# Patient Record
Sex: Male | Born: 1970 | Race: Black or African American | Hispanic: No | Marital: Married | Smoking: Current some day smoker
Health system: Southern US, Community
[De-identification: ages and names within clinical notes are randomized; demographics above are authoritative.]

## PROBLEM LIST (undated history)

## (undated) DIAGNOSIS — K37 Unspecified appendicitis: Secondary | ICD-10-CM

## (undated) DIAGNOSIS — I10 Essential (primary) hypertension: Secondary | ICD-10-CM

## (undated) DIAGNOSIS — J45909 Unspecified asthma, uncomplicated: Secondary | ICD-10-CM

## (undated) HISTORY — DX: Unspecified appendicitis: K37

## (undated) HISTORY — DX: Unspecified asthma, uncomplicated: J45.909

---

## 2005-03-20 ENCOUNTER — Emergency Department: Payer: Self-pay | Admitting: Emergency Medicine

## 2009-05-11 ENCOUNTER — Emergency Department: Payer: Self-pay | Admitting: Emergency Medicine

## 2015-02-24 ENCOUNTER — Emergency Department: Payer: BLUE CROSS/BLUE SHIELD | Admitting: Anesthesiology

## 2015-02-24 ENCOUNTER — Emergency Department: Payer: BLUE CROSS/BLUE SHIELD

## 2015-02-24 ENCOUNTER — Observation Stay
Admission: EM | Admit: 2015-02-24 | Discharge: 2015-02-25 | Disposition: A | Discharge status: Home / Self Care | Payer: BLUE CROSS/BLUE SHIELD | Attending: Surgery | Admitting: Surgery

## 2015-02-24 ENCOUNTER — Encounter: Payer: Self-pay | Admitting: Emergency Medicine

## 2015-02-24 ENCOUNTER — Encounter
Admission: EM | Disposition: A | Discharge status: Home / Self Care | Payer: Self-pay | Source: Home / Self Care | Attending: Emergency Medicine

## 2015-02-24 DIAGNOSIS — K353 Acute appendicitis with localized peritonitis, without perforation or gangrene: Secondary | ICD-10-CM | POA: Insufficient documentation

## 2015-02-24 DIAGNOSIS — D72829 Elevated white blood cell count, unspecified: Secondary | ICD-10-CM | POA: Diagnosis not present

## 2015-02-24 DIAGNOSIS — Z888 Allergy status to other drugs, medicaments and biological substances status: Secondary | ICD-10-CM | POA: Insufficient documentation

## 2015-02-24 DIAGNOSIS — F17200 Nicotine dependence, unspecified, uncomplicated: Secondary | ICD-10-CM | POA: Diagnosis not present

## 2015-02-24 DIAGNOSIS — Z7982 Long term (current) use of aspirin: Secondary | ICD-10-CM | POA: Diagnosis not present

## 2015-02-24 DIAGNOSIS — K37 Unspecified appendicitis: Secondary | ICD-10-CM | POA: Diagnosis present

## 2015-02-24 HISTORY — PX: LAPAROSCOPIC APPENDECTOMY: SHX408

## 2015-02-24 LAB — URINALYSIS COMPLETE WITH MICROSCOPIC (ARMC ONLY)
BACTERIA UA: NONE SEEN
BILIRUBIN URINE: NEGATIVE
GLUCOSE, UA: NEGATIVE mg/dL
Hgb urine dipstick: NEGATIVE
Ketones, ur: NEGATIVE mg/dL
Leukocytes, UA: NEGATIVE
Nitrite: NEGATIVE
Protein, ur: NEGATIVE mg/dL
SQUAMOUS EPITHELIAL / LPF: NONE SEEN
Specific Gravity, Urine: 1.019 (ref 1.005–1.030)
pH: 7 (ref 5.0–8.0)

## 2015-02-24 LAB — COMPREHENSIVE METABOLIC PANEL
ALBUMIN: 4 g/dL (ref 3.5–5.0)
ALT: 45 U/L (ref 17–63)
AST: 22 U/L (ref 15–41)
Alkaline Phosphatase: 53 U/L (ref 38–126)
Anion gap: 8 (ref 5–15)
BILIRUBIN TOTAL: 0.9 mg/dL (ref 0.3–1.2)
BUN: 8 mg/dL (ref 6–20)
CHLORIDE: 103 mmol/L (ref 101–111)
CO2: 25 mmol/L (ref 22–32)
Calcium: 9.1 mg/dL (ref 8.9–10.3)
Creatinine, Ser: 1.11 mg/dL (ref 0.61–1.24)
GFR calc Af Amer: >60 mL/min (ref >60)
GFR calc non Af Amer: >60 mL/min (ref >60)
GLUCOSE: 112 mg/dL — AB (ref 65–99)
POTASSIUM: 3.9 mmol/L (ref 3.5–5.1)
Sodium: 136 mmol/L (ref 135–145)
TOTAL PROTEIN: 8.1 g/dL (ref 6.5–8.1)

## 2015-02-24 LAB — CBC
HEMATOCRIT: 45 % (ref 40.0–52.0)
Hemoglobin: 15.2 g/dL (ref 13.0–18.0)
MCH: 30.9 pg (ref 26.0–34.0)
MCHC: 33.8 g/dL (ref 32.0–36.0)
MCV: 91.4 fL (ref 80.0–100.0)
Platelets: 317 10*3/uL (ref 150–440)
RBC: 4.92 MIL/uL (ref 4.40–5.90)
RDW: 14.5 % (ref 11.5–14.5)
WBC: 22.2 10*3/uL — ABNORMAL HIGH (ref 3.8–10.6)

## 2015-02-24 LAB — LIPASE, BLOOD: Lipase: 26 U/L (ref 11–51)

## 2015-02-24 SURGERY — APPENDECTOMY, LAPAROSCOPIC
Anesthesia: General | Site: Abdomen | Wound class: Clean Contaminated

## 2015-02-24 MED ORDER — SUCCINYLCHOLINE CHLORIDE 20 MG/ML IJ SOLN
INTRAMUSCULAR | Status: DC | PRN
  Administered 2015-02-24: 120 mg via INTRAVENOUS

## 2015-02-24 MED ORDER — FENTANYL CITRATE (PF) 100 MCG/2ML IJ SOLN
INTRAMUSCULAR | Status: DC | PRN
  Administered 2015-02-24 (×2): 50 ug via INTRAVENOUS
  Administered 2015-02-24: 100 ug via INTRAVENOUS

## 2015-02-24 MED ORDER — LIDOCAINE HCL (CARDIAC) 20 MG/ML IV SOLN
INTRAVENOUS | Status: DC | PRN
  Administered 2015-02-24: 50 mg via INTRAVENOUS

## 2015-02-24 MED ORDER — MORPHINE SULFATE (PF) 4 MG/ML IV SOLN
4.0000 mg | Freq: Once | INTRAVENOUS | Status: AC
  Administered 2015-02-24: 4 mg via INTRAVENOUS
  Filled 2015-02-24: qty 1

## 2015-02-24 MED ORDER — SODIUM CHLORIDE 0.9 % IV SOLN
1.0000 g | INTRAVENOUS | Status: DC
  Administered 2015-02-24: 1 g via INTRAVENOUS
  Filled 2015-02-24 (×2): qty 1

## 2015-02-24 MED ORDER — IOHEXOL 240 MG/ML SOLN
25.0000 mL | Freq: Once | INTRAMUSCULAR | Status: AC | PRN
  Administered 2015-02-24: 25 mL via ORAL

## 2015-02-24 MED ORDER — ONDANSETRON HCL 4 MG/2ML IJ SOLN
4.0000 mg | Freq: Once | INTRAMUSCULAR | Status: AC
  Administered 2015-02-24: 4 mg via INTRAVENOUS
  Filled 2015-02-24: qty 2

## 2015-02-24 MED ORDER — PROPOFOL 10 MG/ML IV BOLUS
INTRAVENOUS | Status: DC | PRN
  Administered 2015-02-24: 200 mg via INTRAVENOUS

## 2015-02-24 MED ORDER — MIDAZOLAM HCL 2 MG/2ML IJ SOLN
INTRAMUSCULAR | Status: DC | PRN
  Administered 2015-02-24: 2 mg via INTRAVENOUS

## 2015-02-24 MED ORDER — IOHEXOL 350 MG/ML SOLN
100.0000 mL | Freq: Once | INTRAVENOUS | Status: AC | PRN
  Administered 2015-02-24: 100 mL via INTRAVENOUS

## 2015-02-24 SURGICAL SUPPLY — 43 items
APPLIER CLIP ROT 10 11.4 M/L (STAPLE) ×3
BAG COUNTER SPONGE EZ (MISCELLANEOUS) IMPLANT
BLADE SURG SZ11 CARB STEEL (BLADE) ×3 IMPLANT
CANISTER SUCT 1200ML W/VALVE (MISCELLANEOUS) ×3 IMPLANT
CATH TRAY 16F METER LATEX (MISCELLANEOUS) ×3 IMPLANT
CHLORAPREP W/TINT 26ML (MISCELLANEOUS) ×3 IMPLANT
CLIP APPLIE ROT 10 11.4 M/L (STAPLE) ×1 IMPLANT
CLOSURE WOUND 1/2 X4 (GAUZE/BANDAGES/DRESSINGS) ×1
COUNTER SPONGE BAG EZ (MISCELLANEOUS)
CUTTER LINEAR ENDO 35 ART THIN (STAPLE) ×3 IMPLANT
DRSG TEGADERM 2-3/8X2-3/4 SM (GAUZE/BANDAGES/DRESSINGS) ×9 IMPLANT
DRSG TELFA 3X8 NADH (GAUZE/BANDAGES/DRESSINGS) ×3 IMPLANT
ENDOLOOP SUT PDS II  0 18 (SUTURE) ×2
ENDOLOOP SUT PDS II 0 18 (SUTURE) ×1 IMPLANT
ENDOPOUCH RETRIEVER 10 (MISCELLANEOUS) ×3 IMPLANT
GLOVE BIO SURGEON STRL SZ7.5 (GLOVE) ×21 IMPLANT
GOWN STRL REUS W/ TWL LRG LVL3 (GOWN DISPOSABLE) ×3 IMPLANT
GOWN STRL REUS W/TWL LRG LVL3 (GOWN DISPOSABLE) ×6
IRRIGATION STRYKERFLOW (MISCELLANEOUS) ×1 IMPLANT
IRRIGATOR STRYKERFLOW (MISCELLANEOUS) ×3
IV NS 1000ML (IV SOLUTION) ×2
IV NS 1000ML BAXH (IV SOLUTION) ×1 IMPLANT
KIT RM TURNOVER STRD PROC AR (KITS) ×3 IMPLANT
LABEL OR SOLS (LABEL) ×3 IMPLANT
NEEDLE HYPO 25X1 1.5 SAFETY (NEEDLE) ×3 IMPLANT
NS IRRIG 500ML POUR BTL (IV SOLUTION) ×3 IMPLANT
PACK LAP CHOLECYSTECTOMY (MISCELLANEOUS) ×3 IMPLANT
PAD GROUND ADULT SPLIT (MISCELLANEOUS) ×3 IMPLANT
RELOAD CUTTER ETS 35MM STAND (ENDOMECHANICALS) ×6 IMPLANT
SCISSORS METZENBAUM CVD 33 (INSTRUMENTS) ×3 IMPLANT
SEAL FOR SCOPE WARMER C3101 (MISCELLANEOUS) ×3 IMPLANT
SLEEVE ENDOPATH XCEL 5M (ENDOMECHANICALS) ×3 IMPLANT
SLEEVE SCD COMPRESS THIGH MED (MISCELLANEOUS) ×3 IMPLANT
STRIP CLOSURE SKIN 1/2X4 (GAUZE/BANDAGES/DRESSINGS) ×2 IMPLANT
SUT MNCRL 4-0 (SUTURE) ×2
SUT MNCRL 4-0 27XMFL (SUTURE) ×1
SUT VICRYL 0 AB UR-6 (SUTURE) ×6 IMPLANT
SUTURE MNCRL 4-0 27XMF (SUTURE) ×1 IMPLANT
SWABSTK COMLB BENZOIN TINCTURE (MISCELLANEOUS) ×3 IMPLANT
TROCAR XCEL BLUNT TIP 100MML (ENDOMECHANICALS) ×3 IMPLANT
TROCAR XCEL NON-BLD 5MMX100MML (ENDOMECHANICALS) ×3 IMPLANT
TUBING INSUFFLATOR HI FLOW (MISCELLANEOUS) ×3 IMPLANT
WATER STERILE IRR 1000ML POUR (IV SOLUTION) ×3 IMPLANT

## 2015-02-24 NOTE — ED Provider Notes (Signed)
[facility] Emergency Department Provider Note  ____________________________________________  Time seen: 1:40 PM  I have reviewed the triage vital signs and the nursing notes.   HISTORY  Chief Complaint Abdominal Pain    HPI Ferdie L Davitt is a 44 y.o. male who presents with severe sharp right lower quadrant abdominal pain which started yesterday. He reports the pain steadily worsened over the last 24 hours. Denies fevers but has had chills. Decreased appetite. Mild nausea but no vomiting. No history of similar pain. No dysuria or hematuria. No sick contacts. No diarrhea.     History reviewed. No pertinent past medical history.  There are no active problems to display for this patient.   History reviewed. No pertinent past surgical history.  No current outpatient prescriptions on file.  Allergies Reglan  No family history on file.  Social History Social History  Substance Use Topics  . Smoking status: Current Some Day Smoker  . Smokeless tobacco: None  . Alcohol Use: Yes    Review of Systems  Constitutional: Negative for fever. Positive for chills Eyes: Negative for visual changes. ENT: Negative for sore throat Cardiovascular: Negative for chest pain. Respiratory: Negative for shortness of breath. Gastrointestinal: Positive for abdominal pain, decreased by mouth intake Genitourinary: Negative for dysuria. Musculoskeletal: Negative for back pain. Skin: Negative for rash. Neurological: Negative for headaches or focal weakness Psychiatric: No anxiety    ____________________________________________   PHYSICAL EXAM:  VITAL SIGNS: ED Triage Vitals  Enc Vitals Group     BP 02/24/15 1226 135/86 mmHg     Pulse Rate 02/24/15 1226 84     Resp 02/24/15 1226 18     Temp 02/24/15 1226 98.5 F (36.9 C)     Temp Source 02/24/15 1226 Oral     SpO2 02/24/15 1226 96 %     Weight 02/24/15 1226 200 lb (90.719 kg)     Height 02/24/15 1226 5'  9" (1.753 m)     Head Cir --      Peak Flow --      Pain Score 02/24/15 1224 8     Pain Loc --      Pain Edu? --      Excl. in GC? --      Constitutional: Alert and oriented. Well appearing and in no distress. Eyes: Conjunctivae are normal.  ENT   Head: Normocephalic and atraumatic.   Mouth/Throat: Mucous membranes are moist. Cardiovascular: Normal rate, regular rhythm. Normal and symmetric distal pulses are present in all extremities. No murmurs, rubs, or gallops. Respiratory: Normal respiratory effort without tachypnea nor retractions. Breath sounds are clear and equal bilaterally.  Gastrointestinal: Significant tenderness to palpation at McBurney's point. No distention. There is no CVA tenderness. Genitourinary: deferred Musculoskeletal: Nontender with normal range of motion in all extremities. No lower extremity tenderness nor edema. Neurologic:  Normal speech and language. No gross focal neurologic deficits are appreciated. Skin:  Skin is warm, dry and intact. No rash noted. Psychiatric: Mood and affect are normal. Patient exhibits appropriate insight and judgment.  ____________________________________________    LABS (pertinent positives/negatives)  Labs Reviewed  COMPREHENSIVE METABOLIC PANEL - Abnormal; Notable for the following:    Glucose, Bld 112 (*)    All other components within normal limits  CBC - Abnormal; Notable for the following:    WBC 22.2 (*)    All other components within normal limits  LIPASE, BLOOD  URINALYSIS COMPLETEWITH MICROSCOPIC ([facility] ONLY)    ____________________________________________   EKG  None  ____________________________________________    RADIOLOGY I have personally reviewed any xrays that were ordered on this patient: CT consistent with acute appendicitis  ____________________________________________   PROCEDURES  Procedure(s) performed: none  Critical Care performed:  none  ____________________________________________   INITIAL IMPRESSION / ASSESSMENT AND PLAN / ED COURSE  Pertinent labs & imaging results that were available during my care of the patient were reviewed by me and considered in my medical decision making (see chart for details).  Patient's presentation is highly suspicious for appendicitis. We will obtain CT of the pelvis, give morphine 4 mg and Zofran 4 mg IV and reevaluate. Patient has been made nothing by mouth, he has not had anything since this morning when he had a small amount of soup   ----------------------------------------- 3:00 PM on 02/24/2015 -----------------------------------------  Called by radiology and notified of acute appendicitis on CT abdomen pelvis. Consulted surgery for evaluation and admission ____________________________________________   FINAL CLINICAL IMPRESSION(S) / ED DIAGNOSES  Final diagnoses:  Acute appendicitis with localized peritonitis     Jene Everyobert Stanislawa Gaffin, MD 02/24/15 1500

## 2015-02-24 NOTE — Anesthesia Preprocedure Evaluation (Addendum)
Anesthesia Evaluation  Patient identified by MRN, date of birth, ID band Patient awake    Reviewed: Allergy & Precautions, NPO status , Patient's Chart, lab work & pertinent test results  Airway Mallampati: II  TM Distance: >3 FB Neck ROM: Full    Dental  (+) Chipped   Pulmonary Current Smoker,    Pulmonary exam normal breath sounds clear to auscultation       Cardiovascular negative cardio ROS Normal cardiovascular exam     Neuro/Psych negative neurological ROS  negative psych ROS   GI/Hepatic negative GI ROS, Neg liver ROS,   Endo/Other  negative endocrine ROS  Renal/GU negative Renal ROS  negative genitourinary   Musculoskeletal negative musculoskeletal ROS (+)   Abdominal (+)  Abdomen: tender.    Peds negative pediatric ROS (+)  Hematology negative hematology ROS (+)   Anesthesia Other Findings   Reproductive/Obstetrics                            Anesthesia Physical Anesthesia Plan  ASA: II and emergent  Anesthesia Plan: General   Post-op Pain Management:    Induction: Intravenous, Rapid sequence and Cricoid pressure planned  Airway Management Planned: Oral ETT  Additional Equipment:   Intra-op Plan:   Post-operative Plan: Extubation in OR  Informed Consent: I have reviewed the patients History and Physical, chart, labs and discussed the procedure including the risks, benefits and alternatives for the proposed anesthesia with the patient or authorized representative who has indicated his/her understanding and acceptance.   Dental advisory given  Plan Discussed with: CRNA and Surgeon  Anesthesia Plan Comments:         Anesthesia Quick Evaluation

## 2015-02-24 NOTE — ED Notes (Signed)
MD at bedside.  Dr. Cyril LoosenKinner states its probable that patient has appendicitis.  Will scan patient quickly.

## 2015-02-24 NOTE — ED Notes (Signed)
Pt presents with abd pain since yesterday, having trouble with bowels.

## 2015-02-24 NOTE — ED Notes (Signed)
Back from CT

## 2015-02-24 NOTE — ED Notes (Signed)
Pt states unable to give urine specimen at this time, pt given sterile cup with instructions.

## 2015-02-24 NOTE — ED Notes (Signed)
Patient transported to CT 

## 2015-02-24 NOTE — H&P (Signed)
CC: RLQ pain x 1 day  HPI: [name] is a pleasant healthy 44 yo M who presents with 1 day of RLQ pain.  Began acutely.  Has had similar pain in the past, but only lasts a short time.  + chills, no nausea/vomiting.  Small BM yesterday.  No fevers, chest pain, shortness of breath, cough, chest pain, dysuria/hematuria.  Active Ambulatory Problems    Diagnosis Date Noted  . No Active Ambulatory Problems   Resolved Ambulatory Problems    Diagnosis Date Noted  . No Resolved Ambulatory Problems   No Additional Past Medical History   History reviewed. No pertinent past surgical history.  Hand Surgery - right    Medication List    ASK your doctor about these medications        aspirin EC 81 MG tablet  Take 162 mg by mouth every 4 (four) hours as needed for mild pain.       Allergies  Allergen Reactions  . Reglan [Metoclopramide] Other (See Comments)    Reaction:  Unknown    ROS: Full ROS obtained, pertinent positives and negatives as above  Blood pressure 129/86, pulse 90, temperature 98.5 F (36.9 C), temperature source Oral, resp. rate 19, height 5\' 9"  (1.753 m), weight 200 lb (90.719 kg), SpO2 95 %. GEN: NAD/A&Ox3 FACE: no obvious facial trauma, normal external nose, normal external ears EYES: no scleral icterus, no conjunctivitis HEAD: normocephalic atraumatic CV: RRR, no MRG RESP: moving air well, lungs clear ABD: soft, tender to RLQ, + rebound, nondistended EXT: moving all ext well, strength 5/5 NEURO: cnII-XII grossly intact, sensation intact all 4 ext  Labs: Personally reviewed, significant for  WBC 22.2  CT:  Personally reviewed, significant for edematous, thickened appendix with periappendiceal stranding  A/P 44 yo with RLQ pain, leukocytosis, CT scan concerning for acute appendicitis.  I have recommended laparoscopic appendectomy.  I have explained the procedure and its risks and benefits including bleeding, pain, scarring, possible conversion to open and  possible injury to other structures.  He expresses understanding and would like to proceed.

## 2015-02-25 ENCOUNTER — Encounter: Payer: Self-pay | Admitting: Surgery

## 2015-02-25 DIAGNOSIS — K353 Acute appendicitis with localized peritonitis, without perforation or gangrene: Secondary | ICD-10-CM | POA: Insufficient documentation

## 2015-02-25 DIAGNOSIS — K37 Unspecified appendicitis: Secondary | ICD-10-CM | POA: Diagnosis present

## 2015-02-25 MED ORDER — ACETAMINOPHEN 325 MG PO TABS
650.0000 mg | ORAL_TABLET | Freq: Four times a day (QID) | ORAL | Status: DC | PRN
Start: 2015-02-25 — End: 2015-02-25

## 2015-02-25 MED ORDER — ROCURONIUM BROMIDE 100 MG/10ML IV SOLN
INTRAVENOUS | Status: DC | PRN
  Administered 2015-02-24: 30 mg via INTRAVENOUS

## 2015-02-25 MED ORDER — DEXAMETHASONE SODIUM PHOSPHATE 4 MG/ML IJ SOLN
INTRAMUSCULAR | Status: DC | PRN
  Administered 2015-02-24: 4 mg via INTRAVENOUS

## 2015-02-25 MED ORDER — OXYCODONE-ACETAMINOPHEN 5-325 MG PO TABS
1.0000 | ORAL_TABLET | ORAL | Status: DC | PRN
  Administered 2015-02-25: 1 via ORAL
  Filled 2015-02-25: qty 1

## 2015-02-25 MED ORDER — OXYCODONE-ACETAMINOPHEN 5-325 MG PO TABS: 1.0000 | ORAL_TABLET | ORAL | Status: AC | PRN

## 2015-02-25 MED ORDER — ONDANSETRON HCL 4 MG/2ML IJ SOLN
INTRAMUSCULAR | Status: DC | PRN
  Administered 2015-02-25: 4 mg via INTRAVENOUS

## 2015-02-25 MED ORDER — ONDANSETRON HCL 4 MG/2ML IJ SOLN: 4.0000 mg | Freq: Once | INTRAMUSCULAR | Status: DC | PRN

## 2015-02-25 MED ORDER — PANTOPRAZOLE SODIUM 40 MG IV SOLR
40.0000 mg | Freq: Every day | INTRAVENOUS | Status: DC
  Administered 2015-02-25: 40 mg via INTRAVENOUS
  Filled 2015-02-25 (×5): qty 40

## 2015-02-25 MED ORDER — EPHEDRINE SULFATE 50 MG/ML IJ SOLN
INTRAMUSCULAR | Status: DC | PRN
  Administered 2015-02-24: 10 mg via INTRAVENOUS

## 2015-02-25 MED ORDER — LACTATED RINGERS IV SOLN
INTRAVENOUS | Status: DC | PRN
  Administered 2015-02-24: 23:00:00 via INTRAVENOUS

## 2015-02-25 MED ORDER — BUPIVACAINE-EPINEPHRINE (PF) 0.25% -1:200000 IJ SOLN
INTRAMUSCULAR | Status: DC | PRN
  Administered 2015-02-25: 7.5 mL via PERINEURAL

## 2015-02-25 MED ORDER — ONDANSETRON 4 MG PO TBDP: 4.0000 mg | ORAL_TABLET | Freq: Four times a day (QID) | ORAL | Status: DC | PRN

## 2015-02-25 MED ORDER — NEOSTIGMINE METHYLSULFATE 10 MG/10ML IV SOLN
INTRAVENOUS | Status: DC | PRN
  Administered 2015-02-25: 3 mg via INTRAVENOUS

## 2015-02-25 MED ORDER — PHENYLEPHRINE HCL 10 MG/ML IJ SOLN
INTRAMUSCULAR | Status: DC | PRN
  Administered 2015-02-24 (×3): 100 ug via INTRAVENOUS

## 2015-02-25 MED ORDER — ONDANSETRON HCL 4 MG/2ML IJ SOLN: 4.0000 mg | Freq: Four times a day (QID) | INTRAMUSCULAR | Status: DC | PRN

## 2015-02-25 MED ORDER — FENTANYL CITRATE (PF) 100 MCG/2ML IJ SOLN: 25.0000 ug | INTRAMUSCULAR | Status: DC | PRN

## 2015-02-25 MED ORDER — ACETAMINOPHEN 650 MG RE SUPP
650.0000 mg | Freq: Four times a day (QID) | RECTAL | Status: DC | PRN
  Filled 2015-02-25: qty 1

## 2015-02-25 MED ORDER — SODIUM CHLORIDE 0.9 % IV SOLN
INTRAVENOUS | Status: DC
  Administered 2015-02-25 (×2): via INTRAVENOUS

## 2015-02-25 MED ORDER — GLYCOPYRROLATE 0.2 MG/ML IJ SOLN
INTRAMUSCULAR | Status: DC | PRN
  Administered 2015-02-24: 0.2 mg via INTRAVENOUS
  Administered 2015-02-25: 0.6 mg via INTRAVENOUS

## 2015-02-25 MED ORDER — OXYCODONE-ACETAMINOPHEN 5-325 MG PO TABS: 1.0000 | ORAL_TABLET | ORAL | Status: DC | PRN

## 2015-02-25 MED ORDER — HYDROMORPHONE HCL 1 MG/ML IJ SOLN
0.5000 mg | INTRAMUSCULAR | Status: DC | PRN
  Administered 2015-02-25 (×2): 1 mg via INTRAVENOUS
  Administered 2015-02-25: 0.5 mg via INTRAVENOUS
  Filled 2015-02-25 (×3): qty 1

## 2015-02-25 MED ORDER — LIDOCAINE HCL 1 % IJ SOLN
INTRAMUSCULAR | Status: DC | PRN
  Administered 2015-02-25: 7.5 mL via INTRADERMAL

## 2015-02-25 MED ORDER — PNEUMOCOCCAL VAC POLYVALENT 25 MCG/0.5ML IJ INJ: 0.5000 mL | INJECTION | INTRAMUSCULAR | Status: DC

## 2015-02-25 NOTE — Discharge Instructions (Signed)
Do not drive on pain medications Do not lift greater than 15 lbs for a period of 6 weeks Call or return to ER if you develop fever greater than 101.5, nausea/vomiting, increased pain, redness/drainage from incisions Take bandages off in 48 hours.  Okay to shower with bandages on or after they come off, no tub baths  Follow all MD discharge instructions. Take all medications as prescribed. Keep all follow up appointments. If your symptoms return, call your doctor. If you experience any new symptoms that are of concern to you or that are bothersome to you, call your doctor. For all questions and/or concerns, call your doctor.   If you have a medical emergency, call 911  If you experience any bleeding or discharge from your incision sites, redness or swelling at your incision sites, fever, pain uncontrolled by medication, increase in pain, or any nausea, call your doctor.

## 2015-02-25 NOTE — Transfer of Care (Signed)
Immediate Anesthesia Transfer of Care Note  Patient: Daniel Hughes  Procedure(s) Performed: Procedure(s): APPENDECTOMY LAPAROSCOPIC (N/A)  Patient Location: PACU  Anesthesia Type:General  Level of Consciousness: awake, alert , oriented and patient cooperative  Airway & Oxygen Therapy: Patient Spontanous Breathing and Patient connected to face mask oxygen  Post-op Assessment: Report given to RN and Post -op Vital signs reviewed and stable  Post vital signs: Reviewed and stable  Last Vitals:  Filed Vitals:   02/24/15 2100 02/24/15 2130  BP: 123/85 124/88  Pulse: 75 71  Temp:    Resp: 12 12    Complications: No apparent anesthesia complications

## 2015-02-25 NOTE — Op Note (Signed)
Preoperative dx: Appendicitis Postoperative dx: Appendicitis, gangrenous Procedure: Laparoscopic appendectomy  Surgeon: Finas Delone Anesthesia: GETA EBL: 25 ml Specimen: Appendix.  Indication for procedure: Mr. Raul Dellston presented with 1 day of RLQ pain and clinical and radiographic signs of appendicitis.  We brought him to the OR for surgical management of appendicitis.  Details of procedure:  Informed consent was obtained.  Mr. Raul Dellston was brought to the or suite and laid on the OR table.  He was induced, intubated and general anesthesia administered.  His abdomen was prepped and draped.  Time out performed.  Incision was made over umbilicus.  Deepened down to fascia and fascia incised.  Two stay sutures placed in fasciotomy, hassan trocar placed, abdomen was insufflated.  Two 5 mm trocars were placed under direct visualization in RLQ and suprapubic.  The appendix was visualized and remarkably inflammed.  It curved laterally under the cecum and had small areas of patchy necrosis.  A hole was made in mesoappendix and appendix transected flush with cecum using endoGIA stapler.  The body and tip of the appendix was then brought out from the retroperitoneum.  The mesoappendix was then divided with 2 firings of the endoGIA and electrocautery.  Appendix then removed using endocatch bag.  Abdomen irrigated, hemostasis obtained.  All trocars were then removed under direct visualization.  Supraumbilical fascia closed using previously placed stay sutures.  All skin sites closed with 4-0 monocryl interruped deep dermal sutures.  Steristrips, telfa and tegaderm were used to complete dressing.  Patient awoken, extubated and brought to PACU.  No immediate complications, needle, sponge and instrument count correct at end of procedure.

## 2015-02-25 NOTE — Brief Op Note (Signed)
02/24/2015 - 02/25/2015  1:18 AM  PATIENT:  Daniel Hughes  44 y.o. male  PRE-OPERATIVE DIAGNOSIS:  appendicitis  POST-OPERATIVE DIAGNOSIS:  Appendicitis, gangrenous  PROCEDURE:  Procedure(s): APPENDECTOMY LAPAROSCOPIC (N/A)  SURGEON:  Surgeon(s) and Role:    * Ida Roguehristopher Shacara Cozine, MD - Primary  PHYSICIAN ASSISTANT:   ASSISTANTS: none   ANESTHESIA:   general  EBL:  Total I/O In: 800 [I.V.:800] Out: 210 [Urine:200; Blood:10]  BLOOD ADMINISTERED:none  DRAINS: none   LOCAL MEDICATIONS USED:  LIDOCAINE   SPECIMEN:  Excision  DISPOSITION OF SPECIMEN:  PATHOLOGY  COUNTS:  YES  TOURNIQUET:  * No tourniquets in log *  DICTATION: .Note written in EPIC  PLAN OF CARE: Admit for overnight observation  PATIENT DISPOSITION:  PACU - hemodynamically stable.   Delay start of Pharmacological VTE agent (>24hrs) due to surgical blood loss or risk of bleeding: not applicable

## 2015-02-25 NOTE — Progress Notes (Signed)
Surgery Progress Note  S: Doing well.  Some periumbilical pain O:Blood pressure 111/70, pulse 80, temperature 98.7 F (37.1 C), temperature source Oral, resp. rate 16, height 5\' 9"  (1.753 m), weight 200 lb (90.719 kg), SpO2 96 %. GEN: NAD/A&Ox3 ABD: soft, approp tender, dressings with some blood, intact  A/P 44 yo s/p lap appendectomy, doing well - liquids advance - IV pain meds, oral when tolerating PO

## 2015-02-25 NOTE — Discharge Summary (Signed)
Physician Discharge Summary  Patient ID: Daniel Hughes MRN: [id number] DOB/AGE: 44-Oct-1972 44 y.o.  Admit date: 02/24/2015 Discharge date: 02/25/2015  Admission Diagnoses: Acute appendicitis  Discharge Diagnoses:  Active Problems:   Acute appendicitis with localized peritonitis   Appendicitis   Discharged Condition: good  Hospital Course:  Lap appy on 11/22, doing well today.  Tolerating regular diet, up and moving around.  Pain controlled with PO meds.    Consults: None  Significant Diagnostic Studies: CT san  Treatments: IV hydration  Discharge Exam: Blood pressure 109/77, pulse 74, temperature 98.3 F (36.8 C), temperature source Oral, resp. rate 16, height 5\' 9"  (1.753 m), weight 200 lb (90.719 kg), SpO2 96 %. General appearance: alert, cooperative and no distress GI: soft, appropriately tender, incisions c/d/i Extremities: extremities normal, atraumatic, no cyanosis or edema  Disposition: Final discharge disposition not confirmed  Discharge Instructions    Call MD for:  persistant nausea and vomiting    Complete by:  As directed      Call MD for:  redness, tenderness, or signs of infection (pain, swelling, redness, odor or green/yellow discharge around incision site)    Complete by:  As directed      Call MD for:  severe uncontrolled pain    Complete by:  As directed      Call MD for:  temperature >100.4    Complete by:  As directed      Driving Restrictions    Complete by:  As directed   No driving while on prescription meds     Lifting restrictions    Complete by:  As directed   No lifting over 15 lbs     May shower / Bathe    Complete by:  As directed      May walk up steps    Complete by:  As directed      Remove dressing in 48 hours    Complete by:  As directed             Medication List    TAKE these medications        aspirin EC 81 MG tablet  Take 162 mg by mouth every 4 (four) hours as needed for mild pain.     oxyCODONE-acetaminophen  5-325 MG tablet  Commonly known as:  PERCOCET/ROXICET  Take 1-2 tablets by mouth every 4 (four) hours as needed for moderate pain.     oxyCODONE-acetaminophen 5-325 MG tablet  Commonly known as:  ROXICET  Take 1-2 tablets by mouth every 4 (four) hours as needed for severe pain.           Follow-up Information    Follow up with [facility]. Schedule an appointment as soon as possible for a visit in 1 week.   Why:  Please call as soon as possible to schedule a follow up appointment      Signed: Gladis Riffleatherine L Galvin Aversa 02/25/2015, 6:31 PM

## 2015-02-25 NOTE — Progress Notes (Signed)
Ambulating today,mid abdominal incision with minimal oozing,pain management in progress,iv fluids infusing,vital signs within normal limits.

## 2015-02-25 NOTE — Anesthesia Procedure Notes (Signed)
Procedure Name: Intubation Date/Time: 02/24/2015 11:36 PM Performed by: Waldo LaineJUSTIS, Tiare Rohlman Pre-anesthesia Checklist: Patient identified, Emergency Drugs available, Suction available, Patient being monitored and Timeout performed Patient Re-evaluated:Patient Re-evaluated prior to inductionOxygen Delivery Method: Circle system utilized Preoxygenation: Pre-oxygenation with 100% oxygen Intubation Type: IV induction and Cricoid Pressure applied Laryngoscope Size: Miller and 2 Grade View: Grade II Number of attempts: 1 Airway Equipment and Method: Stylet Placement Confirmation: ETT inserted through vocal cords under direct vision,  positive ETCO2 and breath sounds checked- equal and bilateral Secured at: 22 cm Tube secured with: Tape Dental Injury: Teeth and Oropharynx as per pre-operative assessment

## 2015-02-25 NOTE — Progress Notes (Signed)
Pt d/c home; d/c instructions reviewed w/ pt; pt understanding was verbalized; IV removed catheter in tact, gauze dressing applied; all pt questions answered; pt awaiting ride to arrive; pt instructed to notify RN or nurse's station when ride arrives, and that he would be taken down by wheelchair; oncoming charge nurse Leavy CellaJasmine notified that pt awaiting ride home to arrive

## 2015-02-27 LAB — SURGICAL PATHOLOGY

## 2015-02-27 NOTE — Addendum Note (Signed)
Addendum  created 02/27/15 0906 by Yves DillPaul Theo Reither, MD   Modules edited: Clinical Notes   Clinical Notes:  File: [id number]

## 2015-02-27 NOTE — Anesthesia Postprocedure Evaluation (Signed)
Anesthesia Post Note  Patient: Stephano L Desrocher  Procedure(s) Performed: Procedure(s) (LRB): APPENDECTOMY LAPAROSCOPIC (N/A)  Patient location during evaluation: PACU Anesthesia Type: General Level of consciousness: awake, oriented and awake and alert Pain management: pain level controlled Vital Signs Assessment: post-procedure vital signs reviewed and stable Respiratory status: spontaneous breathing Cardiovascular status: blood pressure returned to baseline Anesthetic complications: no    Last Vitals:  Filed Vitals:   02/25/15 0800 02/25/15 1341  BP: 116/72 109/77  Pulse: 82 74  Temp: 37.1 C 36.8 C  Resp: 16 16    Last Pain:  Filed Vitals:   02/25/15 1737  PainSc: 4                  Gurneet Matarese

## 2015-03-02 ENCOUNTER — Telehealth: Payer: Self-pay

## 2015-03-02 NOTE — Telephone Encounter (Signed)
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 I told him that since he has a post-operation appointment scheduled to see Dr. Orvis BrillLoflin on 03/04/2015, that I will give him a letter until then. I also told him that once he came in to his appointment that we would tell Dr. Orvis BrillLoflin to give him an extension. Patient understood and had no further questions.

## 2015-03-04 ENCOUNTER — Ambulatory Visit (INDEPENDENT_AMBULATORY_CARE_PROVIDER_SITE_OTHER): Payer: BLUE CROSS/BLUE SHIELD | Admitting: Surgery

## 2015-03-04 ENCOUNTER — Encounter: Payer: Self-pay | Admitting: Surgery

## 2015-03-04 VITALS — BP 147/94 | HR 84 | Temp 98.6°F | Ht 70.0 in | Wt 204.0 lb

## 2015-03-04 DIAGNOSIS — K353 Acute appendicitis with localized peritonitis, without perforation or gangrene: Secondary | ICD-10-CM

## 2015-03-04 NOTE — Progress Notes (Signed)
44 yr old s/p Lap appy for acute appendicitis on 11/23.  Patient doing well today.  He states pain well controlled only some mild soreness around incision sites.  He is slowly getting appetite and energy back. He is having bowel movements.   Filed Vitals:   03/04/15 1011  BP: 147/94  Pulse: 84  Temp: 98.6 F (37 C)   PE:  Gen: NAD Abd: soft, nt, nd incision sites c/d/i   A/P:  Doing well postop, pathology results discussed, work excuse given, can f/u with any issues.

## 2015-03-04 NOTE — Patient Instructions (Signed)
Follow up as needed in our office.

## 2015-03-06 ENCOUNTER — Telehealth: Payer: Self-pay

## 2015-03-06 NOTE — Telephone Encounter (Signed)
Called patient to let him know that his FMLA Form were faxed to [phone] Attention to Wendall Papaan Fryer.

## 2016-06-21 ENCOUNTER — Encounter: Payer: Self-pay | Admitting: Emergency Medicine

## 2016-06-21 ENCOUNTER — Emergency Department
Admission: EM | Admit: 2016-06-21 | Discharge: 2016-06-21 | Disposition: A | Discharge status: Home / Self Care | Payer: BLUE CROSS/BLUE SHIELD | Attending: Emergency Medicine | Admitting: Emergency Medicine

## 2016-06-21 DIAGNOSIS — F17200 Nicotine dependence, unspecified, uncomplicated: Secondary | ICD-10-CM | POA: Insufficient documentation

## 2016-06-21 DIAGNOSIS — R197 Diarrhea, unspecified: Secondary | ICD-10-CM | POA: Insufficient documentation

## 2016-06-21 DIAGNOSIS — R109 Unspecified abdominal pain: Secondary | ICD-10-CM

## 2016-06-21 DIAGNOSIS — R112 Nausea with vomiting, unspecified: Secondary | ICD-10-CM

## 2016-06-21 DIAGNOSIS — J45909 Unspecified asthma, uncomplicated: Secondary | ICD-10-CM | POA: Insufficient documentation

## 2016-06-21 DIAGNOSIS — R1010 Upper abdominal pain, unspecified: Secondary | ICD-10-CM | POA: Insufficient documentation

## 2016-06-21 LAB — COMPREHENSIVE METABOLIC PANEL
ALBUMIN: 4.2 g/dL (ref 3.5–5.0)
ALT: 47 U/L (ref 17–63)
AST: 28 U/L (ref 15–41)
Alkaline Phosphatase: 46 U/L (ref 38–126)
Anion gap: 10 (ref 5–15)
BUN: 12 mg/dL (ref 6–20)
CHLORIDE: 102 mmol/L (ref 101–111)
CO2: 25 mmol/L (ref 22–32)
CREATININE: 1.17 mg/dL (ref 0.61–1.24)
Calcium: 9.5 mg/dL (ref 8.9–10.3)
GFR calc non Af Amer: >60 mL/min (ref >60)
GLUCOSE: 109 mg/dL — AB (ref 65–99)
Potassium: 3.5 mmol/L (ref 3.5–5.1)
SODIUM: 137 mmol/L (ref 135–145)
Total Bilirubin: 0.8 mg/dL (ref 0.3–1.2)
Total Protein: 8.2 g/dL — ABNORMAL HIGH (ref 6.5–8.1)

## 2016-06-21 LAB — CBC
HCT: 45.8 % (ref 40.0–52.0)
Hemoglobin: 15.8 g/dL (ref 13.0–18.0)
MCH: 31.2 pg (ref 26.0–34.0)
MCHC: 34.6 g/dL (ref 32.0–36.0)
MCV: 90.4 fL (ref 80.0–100.0)
PLATELETS: 371 10*3/uL (ref 150–440)
RBC: 5.07 MIL/uL (ref 4.40–5.90)
RDW: 15 % — ABNORMAL HIGH (ref 11.5–14.5)
WBC: 11.5 10*3/uL — ABNORMAL HIGH (ref 3.8–10.6)

## 2016-06-21 LAB — LIPASE, BLOOD: LIPASE: 16 U/L (ref 11–51)

## 2016-06-21 MED ORDER — ONDANSETRON HCL 4 MG/2ML IJ SOLN
4.0000 mg | Freq: Once | INTRAMUSCULAR | Status: AC
  Administered 2016-06-21: 4 mg via INTRAVENOUS
  Filled 2016-06-21: qty 2

## 2016-06-21 MED ORDER — SODIUM CHLORIDE 0.9 % IV BOLUS (SEPSIS)
1000.0000 mL | Freq: Once | INTRAVENOUS | Status: AC
  Administered 2016-06-21: 1000 mL via INTRAVENOUS

## 2016-06-21 MED ORDER — LOPERAMIDE HCL 2 MG PO CAPS
2.0000 mg | ORAL_CAPSULE | Freq: Once | ORAL | Status: AC
  Administered 2016-06-21: 2 mg via ORAL
  Filled 2016-06-21: qty 1

## 2016-06-21 MED ORDER — ONDANSETRON HCL 4 MG PO TABS: 4.0000 mg | ORAL_TABLET | Freq: Every day | ORAL | 0 refills | Status: AC | PRN

## 2016-06-21 MED ORDER — LOPERAMIDE HCL 2 MG PO TABS
2.0000 mg | ORAL_TABLET | Freq: Four times a day (QID) | ORAL | 0 refills | Status: DC | PRN
Start: 2016-06-21 — End: 2023-06-10

## 2016-06-21 NOTE — ED Triage Notes (Signed)
Pt to ed with c/o vomiting and nausea and diarrhea, since Friday.  Pt states 3 episodes of vomiting in the last 24 hours.  Diarrhea x 2 in last 24 hours.

## 2016-06-21 NOTE — ED Notes (Signed)
Initiated PO challenge per MD  - pt given ginger ale and crackers

## 2016-06-21 NOTE — ED Provider Notes (Signed)
[facility] Emergency Department Provider Note  ____________________________________________  Time seen: Approximately 10:41 AM  I have reviewed the triage vital signs and the nursing notes.   HISTORY  Chief Complaint Nausea; Emesis; and Diarrhea    HPI Daniel Hughes is a 46 y.o. male s/p remote appendectomy presenting with nausea vomiting and diarrhea. The patient reports that he was at work on Friday and developed multiple episodes of nausea and vomiting, then followed by some diarrhea. After those symptoms started, he did develop a "sore" feeling in the upper abdomen diffusely. He has not had any fever, chills, lightheadedness or syncope. No sick contacts. Last diarrhea was yesterday. 2 episodes of vomiting but continued nausea over the last 24 hours. The urinary symptoms, no cough or cold symptoms.   Past Medical History:  Diagnosis Date  . Appendicitis   . Asthma     Patient Active Problem List   Diagnosis Date Noted  . Appendicitis 02/25/2015  . Acute appendicitis with localized peritonitis     Past Surgical History:  Procedure Laterality Date  . LAPAROSCOPIC APPENDECTOMY N/A 02/24/2015   Procedure: APPENDECTOMY LAPAROSCOPIC;  Surgeon: Ida Rogue, MD;  Location: [facility] ORS;  Service: General;  Laterality: N/A;    Current Outpatient Rx  . Order #: [id number] Class: Print  . Order #: [id number] Class: Print  . Order #: [id number] Class: Print    Allergies Reglan [metoclopramide]  Family History  Problem Relation Age of Onset  . Diabetes Mother   . Cancer Maternal Grandmother     Social History Social History  Substance Use Topics  . Smoking status: Current Some Day Smoker  . Smokeless tobacco: Never Used  . Alcohol use Yes    Review of Systems Constitutional: No fever/chills.No lightheadedness or syncopal BP. Eyes: No visual changes. ENT: No sore throat. No congestion or rhinorrhea. Cardiovascular: Denies chest pain. Denies  palpitations. Respiratory: Denies shortness of breath.  No cough. Gastrointestinal: Positive diffuse upper abdominal pain.  Positive nausea, positive vomiting.  Positive diarrhea.  No constipation. Genitourinary: Negative for dysuria. Musculoskeletal: Negative for back pain. Skin: Negative for rash. Neurological: Negative for headaches. No focal numbness, tingling or weakness.   10-point ROS otherwise negative.  ____________________________________________   PHYSICAL EXAM:  VITAL SIGNS: ED Triage Vitals  Enc Vitals Group     BP 06/21/16 1012 (!) 135/97     Pulse Rate 06/21/16 1012 77     Resp 06/21/16 1012 16     Temp 06/21/16 1012 98.4 F (36.9 C)     Temp Source 06/21/16 1012 Oral     SpO2 06/21/16 1012 97 %     Weight 06/21/16 1013 195 lb (88.5 kg)     Height 06/21/16 1013 5\' 10"  (1.778 m)     Head Circumference --      Peak Flow --      Pain Score 06/21/16 1013 7     Pain Loc --      Pain Edu? --      Excl. in GC? --     Constitutional: Alert and oriented. Well appearing and in no acute distress. Answers questions appropriately. Eyes: Conjunctivae are normal.  EOMI. No scleral icterus. Head: Atraumatic. Nose: No congestion/rhinnorhea. Mouth/Throat: Mucous membranes are moist.  Neck: No stridor.  Supple.   Cardiovascular: Normal rate, regular rhythm. No murmurs, rubs or gallops.  Respiratory: Normal respiratory effort.  No accessory muscle use or retractions. Lungs CTAB.  No wheezes, rales or ronchi. Gastrointestinal: Soft, and nondistended.  Diffuse mild  tenderness in the entire abdomen without focality. Negative Murphy sign. No guarding or rebound.  No peritoneal signs. Musculoskeletal: No LE edema. No ttp in the calves or palpable cords.  Negative Homan's sign. Neurologic:  A&Ox3.  Speech is clear.  Face and smile are symmetric.  EOMI.  Moves all extremities well. Skin:  Skin is warm, dry and intact. No rash noted. Psychiatric: Mood and affect are normal. Speech  and behavior are normal.  Normal judgement.  ____________________________________________   LABS (all labs ordered are listed, but only abnormal results are displayed)  Labs Reviewed  COMPREHENSIVE METABOLIC PANEL - Abnormal; Notable for the following:       Result Value   Glucose, Bld 109 (*)    Total Protein 8.2 (*)    All other components within normal limits  CBC - Abnormal; Notable for the following:    WBC 11.5 (*)    RDW 15.0 (*)    All other components within normal limits  LIPASE, BLOOD  URINALYSIS, COMPLETE (UACMP) WITH MICROSCOPIC   ____________________________________________  EKG  Not indicated ____________________________________________  RADIOLOGY  No results found.  ____________________________________________   PROCEDURES  Procedure(s) performed: None  Procedures  Critical Care performed: No ____________________________________________   INITIAL IMPRESSION / ASSESSMENT AND PLAN / ED COURSE  Pertinent labs & imaging results that were available during my care of the patient were reviewed by me and considered in my medical decision making (see chart for details).  46 y.o. male s/p remote appendectomy presenting with nausea vomiting and diarrhea. Overall, the patient is reassuring vital signs and is afebrile. His abdominal examination does not have any focality and it is unlikely that he has a acute surgical pathology today. I will plan to treat him symptomatically, and follow his electrolytes as well as urinalysis. If his symptoms improve, I'll plan to discharge him home. It is possible that he has a viral or foodborne GI illness a day. Influenza is less likely but also on the differential given the time of year. I have given him return precautions for localizing pain, or continued or worsened symptoms. [place] to return precautions as well as follow-up instructions.  ----------------------------------------- 11:59 AM on  06/21/2016 -----------------------------------------  The patient is tolerating liquids and solids at this time. He continues to have a soft benign abdomen. He is hemodynamically stable. Plan discharge. He understands return precautions as well as follow-up instructions.  ____________________________________________  FINAL CLINICAL IMPRESSION(S) / ED DIAGNOSES  Final diagnoses:  Nausea vomiting and diarrhea  Abdominal pain, unspecified abdominal location         NEW MEDICATIONS STARTED DURING THIS VISIT:  New Prescriptions   LOPERAMIDE (IMODIUM A-D) 2 MG TABLET    Take 1 tablet (2 mg total) by mouth 4 (four) times daily as needed for diarrhea or loose stools.   ONDANSETRON (ZOFRAN) 4 MG TABLET    Take 1 tablet (4 mg total) by mouth daily as needed for nausea or vomiting.      Rockne MenghiniAnne-Caroline Truett Mcfarlan, MD 06/21/16 1200

## 2016-06-21 NOTE — Discharge Instructions (Signed)
Please take a clear liquid diet for the next 24-48 hours, then advance to a bland diet as instructed paperwork. You may take Zofran for nausea and loperamide for diarrhea. You may take Tylenol or Motrin for your pain.  Return to the emergency department if you develop severe pain, pain that goes to one area of the abdomen, fever, inability to keep down fluids, lightheadedness or fainting, or any other symptoms concerning to you.

## 2018-06-10 ENCOUNTER — Other Ambulatory Visit: Payer: Self-pay

## 2018-06-10 ENCOUNTER — Emergency Department: Payer: BLUE CROSS/BLUE SHIELD

## 2018-06-10 ENCOUNTER — Emergency Department
Admission: EM | Admit: 2018-06-10 | Discharge: 2018-06-10 | Disposition: A | Discharge status: Home / Self Care | Payer: BLUE CROSS/BLUE SHIELD | Attending: Student in an Organized Health Care Education/Training Program | Admitting: Student in an Organized Health Care Education/Training Program

## 2018-06-10 DIAGNOSIS — R1013 Epigastric pain: Secondary | ICD-10-CM | POA: Diagnosis present

## 2018-06-10 DIAGNOSIS — F17210 Nicotine dependence, cigarettes, uncomplicated: Secondary | ICD-10-CM | POA: Insufficient documentation

## 2018-06-10 DIAGNOSIS — R110 Nausea: Secondary | ICD-10-CM | POA: Diagnosis not present

## 2018-06-10 DIAGNOSIS — Z79899 Other long term (current) drug therapy: Secondary | ICD-10-CM | POA: Insufficient documentation

## 2018-06-10 DIAGNOSIS — J45909 Unspecified asthma, uncomplicated: Secondary | ICD-10-CM | POA: Insufficient documentation

## 2018-06-10 LAB — COMPREHENSIVE METABOLIC PANEL
ALT: 32 U/L (ref 0–44)
ANION GAP: 13 (ref 5–15)
AST: 20 U/L (ref 15–41)
Albumin: 4.2 g/dL (ref 3.5–5.0)
Alkaline Phosphatase: 43 U/L (ref 38–126)
BILIRUBIN TOTAL: 1.1 mg/dL (ref 0.3–1.2)
BUN: 15 mg/dL (ref 6–20)
CO2: 22 mmol/L (ref 22–32)
Calcium: 9.4 mg/dL (ref 8.9–10.3)
Chloride: 98 mmol/L (ref 98–111)
Creatinine, Ser: 1.09 mg/dL (ref 0.61–1.24)
Glucose, Bld: 109 mg/dL — ABNORMAL HIGH (ref 70–99)
POTASSIUM: 3.6 mmol/L (ref 3.5–5.1)
Sodium: 133 mmol/L — ABNORMAL LOW (ref 135–145)
TOTAL PROTEIN: 8.3 g/dL — AB (ref 6.5–8.1)

## 2018-06-10 LAB — CBC
HCT: 48.2 % (ref 39.0–52.0)
HEMOGLOBIN: 16.3 g/dL (ref 13.0–17.0)
MCH: 30.4 pg (ref 26.0–34.0)
MCHC: 33.8 g/dL (ref 30.0–36.0)
MCV: 89.9 fL (ref 80.0–100.0)
NRBC: 0 % (ref 0.0–0.2)
Platelets: 372 10*3/uL (ref 150–400)
RBC: 5.36 MIL/uL (ref 4.22–5.81)
RDW: 13.2 % (ref 11.5–15.5)
WBC: 9.8 10*3/uL (ref 4.0–10.5)

## 2018-06-10 LAB — LIPASE, BLOOD: Lipase: 28 U/L (ref 11–51)

## 2018-06-10 LAB — TROPONIN I

## 2018-06-10 MED ORDER — ONDANSETRON HCL 4 MG/2ML IJ SOLN
4.0000 mg | Freq: Once | INTRAMUSCULAR | Status: AC
  Administered 2018-06-10: 4 mg via INTRAVENOUS
  Filled 2018-06-10: qty 2

## 2018-06-10 MED ORDER — SUCRALFATE 1 G PO TABS
1.0000 g | ORAL_TABLET | Freq: Once | ORAL | Status: AC
  Administered 2018-06-10: 1 g via ORAL
  Filled 2018-06-10: qty 1

## 2018-06-10 MED ORDER — FAMOTIDINE 20 MG PO TABS: 20.0000 mg | ORAL_TABLET | Freq: Two times a day (BID) | ORAL | 0 refills | Status: DC

## 2018-06-10 MED ORDER — POLYETHYLENE GLYCOL 3350 17 G PO PACK: 17.0000 g | PACK | Freq: Every day | ORAL | 0 refills | Status: DC

## 2018-06-10 MED ORDER — ONDANSETRON HCL 4 MG PO TABS: 4.0000 mg | ORAL_TABLET | Freq: Every day | ORAL | 0 refills | Status: AC | PRN

## 2018-06-10 MED ORDER — SODIUM CHLORIDE 0.9% FLUSH: 3.0000 mL | Freq: Once | INTRAVENOUS | Status: DC

## 2018-06-10 MED ORDER — SODIUM CHLORIDE 0.9 % IV BOLUS
1000.0000 mL | Freq: Once | INTRAVENOUS | Status: AC
  Administered 2018-06-10: 1000 mL via INTRAVENOUS

## 2018-06-10 NOTE — ED Notes (Signed)
Pt given ginger ale and ice °

## 2018-06-10 NOTE — Discharge Instructions (Addendum)

## 2018-06-10 NOTE — ED Provider Notes (Signed)
2  [facility] Emergency Department Provider Note    First MD Initiated Contact with Patient 06/10/18 1121     (approximate)  I have reviewed the triage vital signs and the nursing notes.   HISTORY  Chief Complaint Abdominal Pain    HPI Daniel Hughes is a 48 y.o. male the below listed past medical history presents with epigastric discomfort and nausea since Tuesday.  Has had decreased oral intake since then.  States on Tuesday he did not have much to eat throughout the day and then had "a bit to drink ".  States that he does drink alcohol frequently.  Denies any history of pancreatitis or liver issues.  States he has been able to keep fluids down but has not had much of an appetite.  Denies any fevers.    Past Medical History:  Diagnosis Date  . Appendicitis   . Asthma    Family History  Problem Relation Age of Onset  . Diabetes Mother   . Cancer Maternal Grandmother    Past Surgical History:  Procedure Laterality Date  . LAPAROSCOPIC APPENDECTOMY N/A 02/24/2015   Procedure: APPENDECTOMY LAPAROSCOPIC;  Surgeon: Ida Rogue, MD;  Location: [facility] ORS;  Service: General;  Laterality: N/A;   Patient Active Problem List   Diagnosis Date Noted  . Appendicitis 02/25/2015  . Acute appendicitis with localized peritonitis       Prior to Admission medications   Medication Sig Start Date End Date Taking? Authorizing Provider  bismuth subsalicylate (PEPTO BISMOL) 262 MG/15ML suspension Take 30 mLs by mouth every 6 (six) hours as needed.   Yes [provider]  famotidine (PEPCID) 20 MG tablet Take 1 tablet (20 mg total) by mouth 2 (two) times daily. 06/10/18 06/10/19  Willy Eddy, MD  loperamide (IMODIUM A-D) 2 MG tablet Take 1 tablet (2 mg total) by mouth 4 (four) times daily as needed for diarrhea or loose stools. 06/21/16   Rockne Menghini, MD  ondansetron (ZOFRAN) 4 MG tablet Take 1 tablet (4 mg total) by mouth daily as needed for  nausea or vomiting. 06/10/18 06/10/19  Willy Eddy, MD  oxyCODONE-acetaminophen (ROXICET) 5-325 MG tablet Take 1-2 tablets by mouth every 4 (four) hours as needed for severe pain. Patient not taking: Reported on 06/10/2018 02/25/15   Gladis Riffle, MD  polyethylene glycol (MIRALAX / GLYCOLAX) packet Take 17 g by mouth daily. Mix one tablespoon with 8oz of your favorite juice or water every day until you are having soft formed stools. Then start taking once daily if you didn't have a stool the day before. 06/10/18   Willy Eddy, MD    Allergies Reglan [metoclopramide]    Social History Social History   Tobacco Use  . Smoking status: Current Some Day Smoker  . Smokeless tobacco: Never Used  Substance Use Topics  . Alcohol use: Yes  . Drug use: No    Review of Systems Patient denies headaches, rhinorrhea, blurry vision, numbness, shortness of breath, chest pain, edema, cough, abdominal pain, nausea, vomiting, diarrhea, dysuria, fevers, rashes or hallucinations unless otherwise stated above in HPI. ____________________________________________   PHYSICAL EXAM:  VITAL SIGNS: Vitals:   06/10/18 1130 06/10/18 1200  BP: (!) 139/105 (!) 137/100  Pulse: 61 62  Resp:    Temp:    SpO2: 96% 96%    Constitutional: Alert and oriented.  Eyes: Conjunctivae are normal.  Head: Atraumatic. Nose: No congestion/rhinnorhea. Mouth/Throat: Mucous membranes are moist.   Neck: No stridor. Painless ROM.  Cardiovascular: Normal rate, regular rhythm. Grossly normal heart sounds.  Good peripheral circulation. Respiratory: Normal respiratory effort.  No retractions. Lungs CTAB. Gastrointestinal: Soft and nontender. No distention. No abdominal bruits. No CVA tenderness. Genitourinary:  Musculoskeletal: No lower extremity tenderness nor edema.  No joint effusions. Neurologic:  Normal speech and language. No gross focal neurologic deficits are appreciated. No facial droop Skin:  Skin is  warm, dry and intact. No rash noted. Psychiatric: Mood and affect are normal. Speech and behavior are normal.  ____________________________________________   LABS (all labs ordered are listed, but only abnormal results are displayed)  Results for orders placed or performed during the hospital encounter of 06/10/18 (from the past 24 hour(s))  Lipase, blood     Status: None   Collection Time: 06/10/18 11:07 AM  Result Value Ref Range   Lipase 28 11 - 51 U/L  Comprehensive metabolic panel     Status: Abnormal   Collection Time: 06/10/18 11:07 AM  Result Value Ref Range   Sodium 133 (L) 135 - 145 mmol/L   Potassium 3.6 3.5 - 5.1 mmol/L   Chloride 98 98 - 111 mmol/L   CO2 22 22 - 32 mmol/L   Glucose, Bld 109 (H) 70 - 99 mg/dL   BUN 15 6 - 20 mg/dL   Creatinine, Ser 9.37 0.61 - 1.24 mg/dL   Calcium 9.4 8.9 - 90.2 mg/dL   Total Protein 8.3 (H) 6.5 - 8.1 g/dL   Albumin 4.2 3.5 - 5.0 g/dL   AST 20 15 - 41 U/L   ALT 32 0 - 44 U/L   Alkaline Phosphatase 43 38 - 126 U/L   Total Bilirubin 1.1 0.3 - 1.2 mg/dL   GFR calc non Af Amer >60 >60 mL/min   GFR calc Af Amer >60 >60 mL/min   Anion gap 13 5 - 15  CBC     Status: None   Collection Time: 06/10/18 11:07 AM  Result Value Ref Range   WBC 9.8 4.0 - 10.5 K/uL   RBC 5.36 4.22 - 5.81 MIL/uL   Hemoglobin 16.3 13.0 - 17.0 g/dL   HCT 40.9 73.5 - 32.9 %   MCV 89.9 80.0 - 100.0 fL   MCH 30.4 26.0 - 34.0 pg   MCHC 33.8 30.0 - 36.0 g/dL   RDW 92.4 26.8 - 34.1 %   Platelets 372 150 - 400 K/uL   nRBC 0.0 0.0 - 0.2 %  Troponin I - Add-On to previous collection     Status: None   Collection Time: 06/10/18 11:07 AM  Result Value Ref Range   Troponin I <0.03 <0.03 ng/mL   ____________________________________________  EKG My review and personal interpretation at Time: 10:57   Indication: epigastric pain  Rate: 70  Rhythm: sinus Axis: normal Other: normal intervals, no stemi ____________________________________________  RADIOLOGY  I  personally reviewed all radiographic images ordered to evaluate for the above acute complaints and reviewed radiology reports and findings.  These findings were personally discussed with the patient.  Please see medical record for radiology report.  ____________________________________________   PROCEDURES  Procedure(s) performed:  Procedures    Critical Care performed: no ____________________________________________   INITIAL IMPRESSION / ASSESSMENT AND PLAN / ED COURSE  Pertinent labs & imaging results that were available during my care of the patient were reviewed by me and considered in my medical decision making (see chart for details).   DDX: Gastritis, pancreatitis, cholecystitis, cholelithiasis, hepatitis, esophagitis, cardiomegaly, ACS  Daniel Hughes is a  48 y.o. who presents to the ED with symptoms as described above.  Patient nontoxic-appearing.  Given his symptoms above differential was considered.  Blood work will be sent for the above differential.  Abdomen is otherwise fairly benign.  Do not feel CT imaging clinically indicated.  Will provide IV fluids as well as antiemetic.  Clinical Course as of Jun 10 1322  Wynelle Link Jun 10, 2018  1318 Patient reassessed.  Repeat abdominal exam soft and benign.  Work-up in the ER is reassuring.  He is tolerating oral hydration.  No evidence of food bolus impaction.  Will be discharged home on stool softener as well as antiemetic as well as antiacid medication with referral to GP.  Have discussed with the patient and available family all diagnostics and treatments performed thus far and all questions were answered to the best of my ability. The patient demonstrates understanding and agreement with plan.    [PR]    Clinical Course User Index [PR] Willy Eddy, MD     As part of my medical decision making, I reviewed the following data within the electronic MEDICAL RECORD NUMBER Nursing notes reviewed and incorporated, Labs reviewed, notes  from prior ED visits and [place] Controlled Substance Database   ____________________________________________   FINAL CLINICAL IMPRESSION(S) / ED DIAGNOSES  Final diagnoses:  Epigastric pain  Nausea      NEW MEDICATIONS STARTED DURING THIS VISIT:  New Prescriptions   FAMOTIDINE (PEPCID) 20 MG TABLET    Take 1 tablet (20 mg total) by mouth 2 (two) times daily.   ONDANSETRON (ZOFRAN) 4 MG TABLET    Take 1 tablet (4 mg total) by mouth daily as needed for nausea or vomiting.   POLYETHYLENE GLYCOL (MIRALAX / GLYCOLAX) PACKET    Take 17 g by mouth daily. Mix one tablespoon with 8oz of your favorite juice or water every day until you are having soft formed stools. Then start taking once daily if you didn't have a stool the day before.     Note:  This document was prepared using Dragon voice recognition software and may include unintentional dictation errors.    Willy Eddy, MD 06/10/18 1324

## 2018-06-10 NOTE — ED Triage Notes (Signed)
Pt presents via EMS c/o epigastric pain since Tuesday. Reports nauseated however unable to vomit.

## 2018-09-21 ENCOUNTER — Telehealth: Payer: Self-pay

## 2018-09-21 NOTE — Telephone Encounter (Signed)
Spoke with patient in regards to workplace concerns r/t COVID-19 and history of asthma. Patient stated he was showing no signs or symptoms of COVID but was being proactive and getting screening by PCP.  Patient was also given some workplace advice on to properly wear his PPE and distance recommendations concerning COVID. Patient verbalized understanding.

## 2018-11-14 ENCOUNTER — Telehealth: Payer: Self-pay

## 2018-11-14 NOTE — Telephone Encounter (Signed)
Received call from patient stating he was sent home per HR due to having runny nose. He stated he went home and took cold medication and stated he is feeling somewhat better. He will stated he will update me on 11/15/18

## 2019-01-30 ENCOUNTER — Other Ambulatory Visit: Payer: Self-pay | Admitting: *Deleted

## 2019-01-30 DIAGNOSIS — Z20822 Contact with and (suspected) exposure to covid-19: Secondary | ICD-10-CM

## 2019-02-01 LAB — NOVEL CORONAVIRUS, NAA: SARS-CoV-2, NAA: NOT DETECTED

## 2020-09-15 ENCOUNTER — Other Ambulatory Visit: Payer: Self-pay | Admitting: Gastroenterology

## 2020-09-15 DIAGNOSIS — B182 Chronic viral hepatitis C: Secondary | ICD-10-CM

## 2020-09-15 DIAGNOSIS — R7401 Elevation of levels of liver transaminase levels: Secondary | ICD-10-CM

## 2020-09-16 ENCOUNTER — Ambulatory Visit
Admission: RE | Admit: 2020-09-16 | Discharge: 2020-09-16 | Disposition: A | Discharge status: Home / Self Care | Payer: 59 | Source: Ambulatory Visit | Attending: Gastroenterology | Admitting: Gastroenterology

## 2020-09-16 ENCOUNTER — Other Ambulatory Visit: Payer: Self-pay

## 2020-09-16 DIAGNOSIS — B182 Chronic viral hepatitis C: Secondary | ICD-10-CM | POA: Insufficient documentation

## 2020-09-16 DIAGNOSIS — R7401 Elevation of levels of liver transaminase levels: Secondary | ICD-10-CM | POA: Diagnosis present

## 2021-08-04 ENCOUNTER — Other Ambulatory Visit: Payer: Self-pay | Admitting: Family Medicine

## 2021-08-04 DIAGNOSIS — R1013 Epigastric pain: Secondary | ICD-10-CM

## 2021-08-09 ENCOUNTER — Ambulatory Visit: Payer: 59

## 2023-05-04 IMAGING — US US ABDOMEN COMPLETE
1 series · 14 of 25 positions shown · non-contrast
Comparison: CT abdomen pelvis 02/24/2015.

CLINICAL DATA: Chronic hepatitis C.

EXAM:
ABDOMEN ULTRASOUND COMPLETE

[Series 1: us abdomen complete · 14 of 107 slices shown]
[im 1/107]
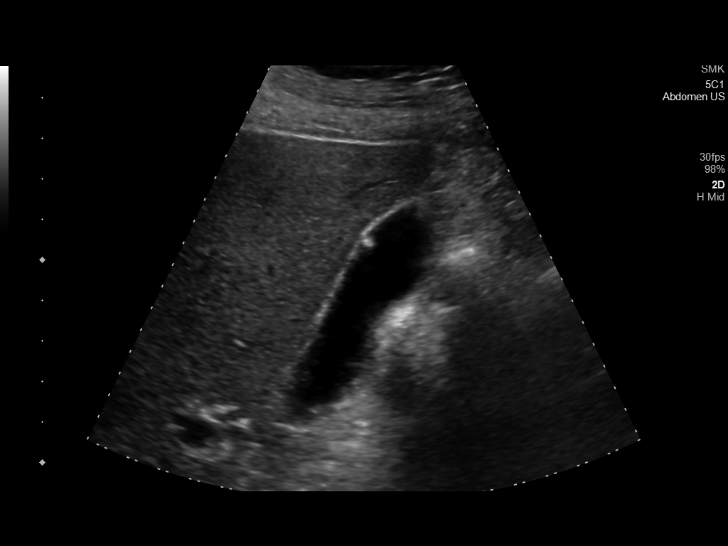
[im 9/107]
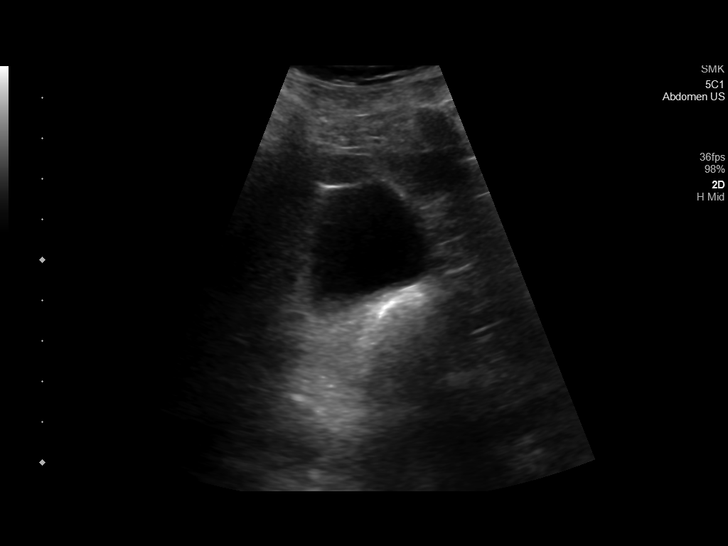
[im 18/107]
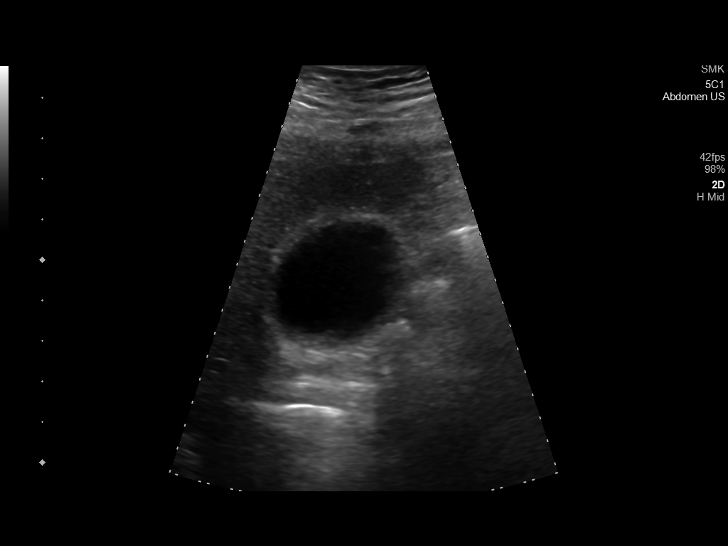
[im 27/107]
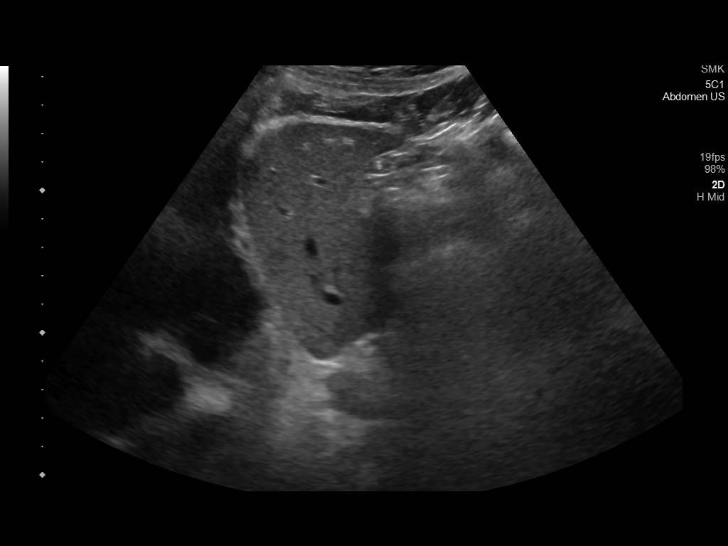
[im 36/107]
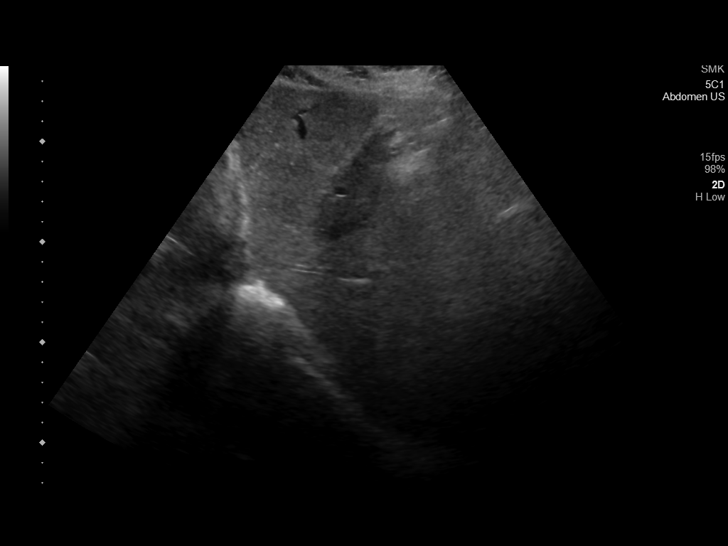
[im 40/107]
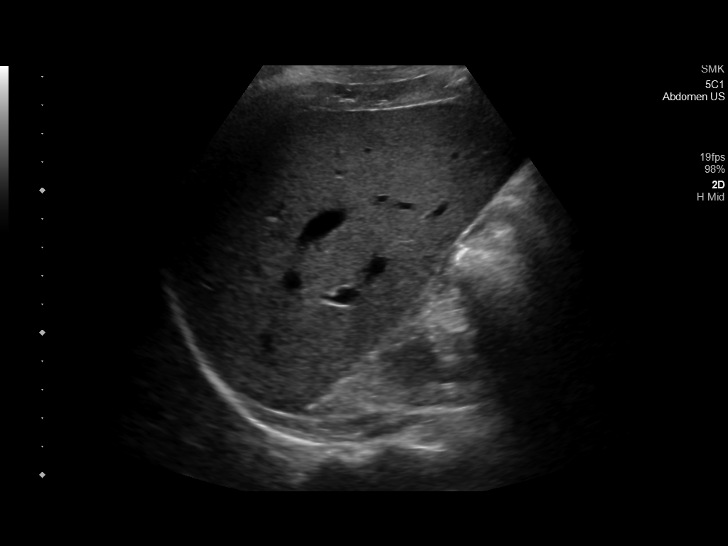
[im 49/107]
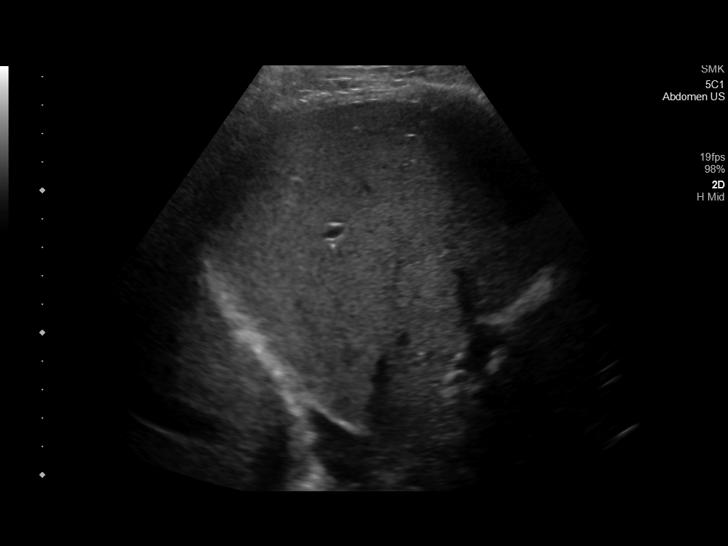
[im 58/107]
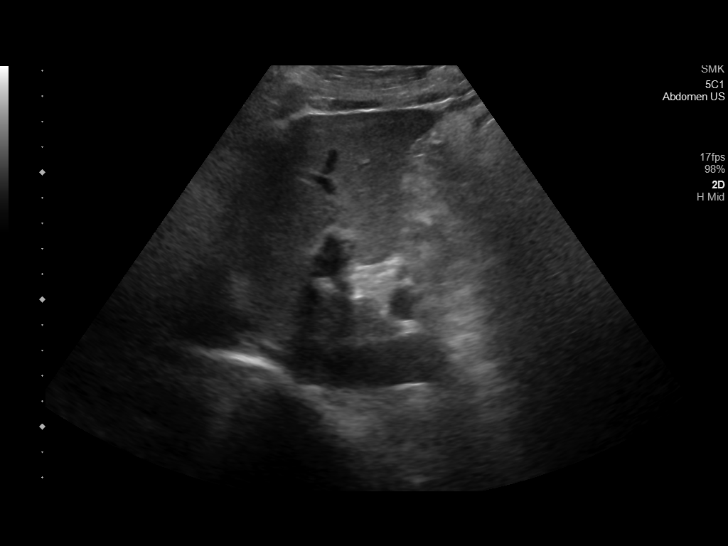
[im 67/107]
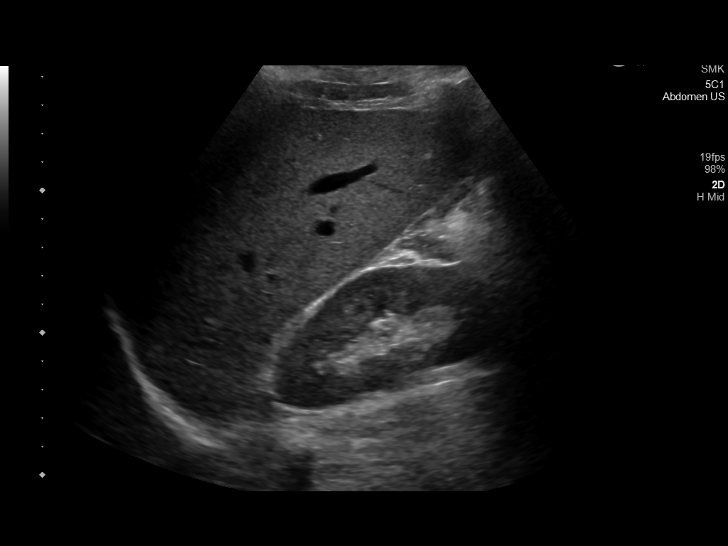
[im 71/107]
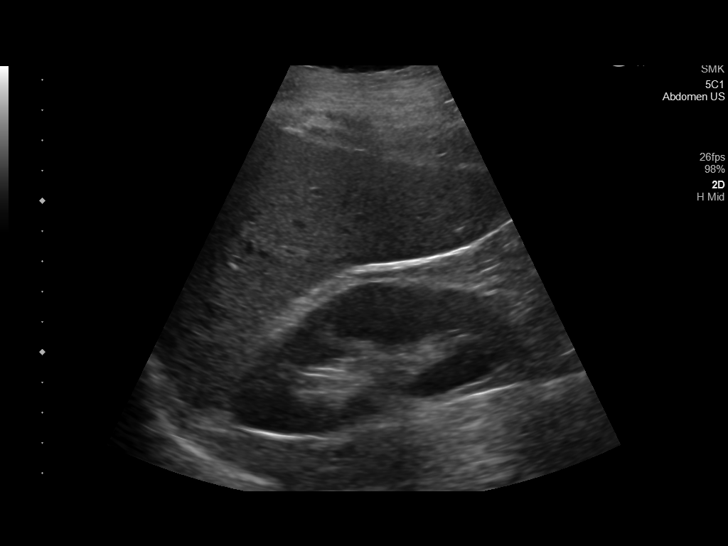
[im 80/107]
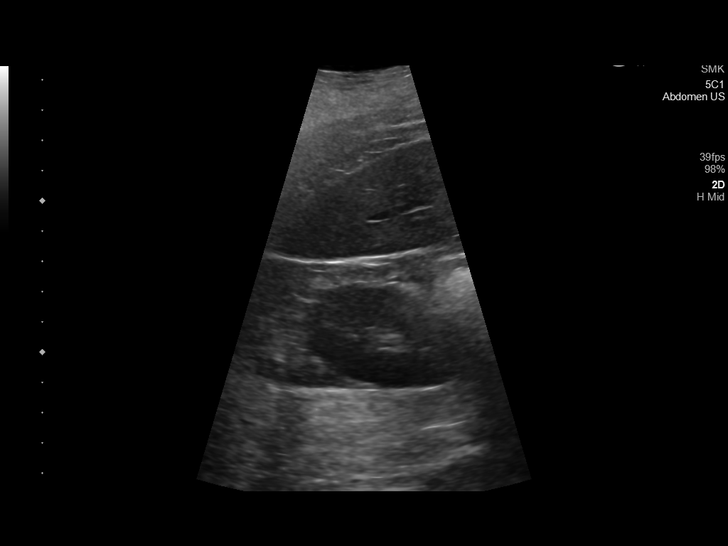
[im 89/107]
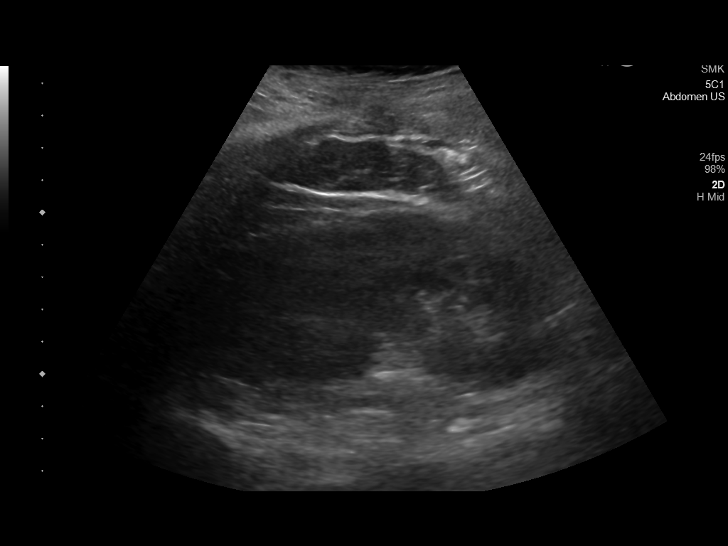
[im 98/107]
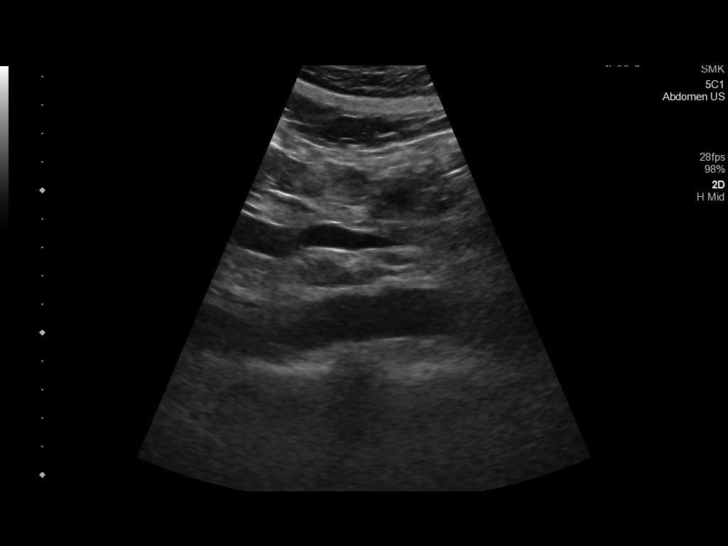
[im 107/107]
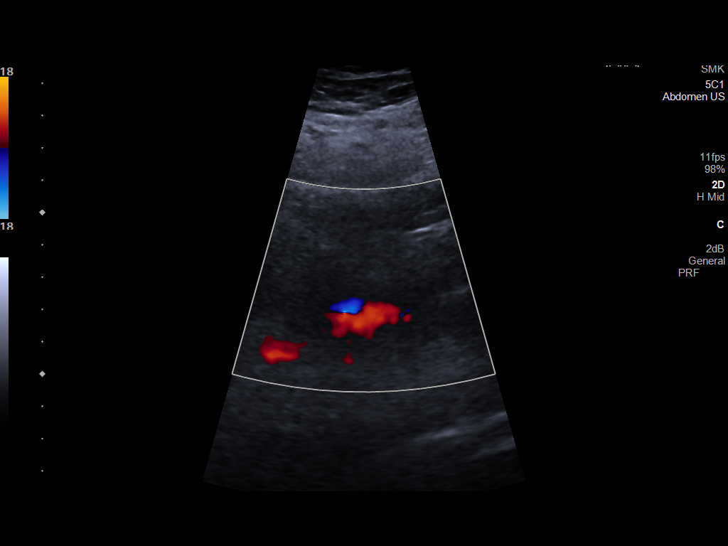

[14 of 25 positions shown; findings below may reference images not displayed]

FINDINGS: Gallbladder: Small gallbladder polyps. No gallstones or wall
thickening. Negative sonographic Murphy sign.

Common bile duct: Diameter: 6 mm, minimally prominent for age.

Liver: Parenchymal echogenicity appears slightly
heterogeneous/increased. No definite focal lesion. Portal vein is
patent on color Doppler imaging with normal direction of blood flow
towards the liver.

IVC: No abnormality visualized.

Pancreas: Poorly visualized due to bowel gas.

Spleen: Size and appearance within normal limits.

Right Kidney: Length: 10.3 cm. Echogenicity within normal limits. No
mass or hydronephrosis visualized.

Left Kidney: Length: 10.3 cm. Echogenicity within normal limits. No
mass or hydronephrosis visualized.

Abdominal aorta: No aneurysm visualized.

Other findings: None.
IMPRESSION: Hepatic steatosis.

## 2023-06-10 ENCOUNTER — Ambulatory Visit: Admission: EM | Admit: 2023-06-10 | Discharge: 2023-06-10 | Disposition: A | Discharge status: Home / Self Care

## 2023-06-10 DIAGNOSIS — R12 Heartburn: Secondary | ICD-10-CM | POA: Diagnosis not present

## 2023-06-10 DIAGNOSIS — R1010 Upper abdominal pain, unspecified: Secondary | ICD-10-CM | POA: Diagnosis not present

## 2023-06-10 HISTORY — DX: Essential (primary) hypertension: I10

## 2023-06-10 MED ORDER — OMEPRAZOLE 20 MG PO CPDR: 20.0000 mg | DELAYED_RELEASE_CAPSULE | Freq: Every day | ORAL | 0 refills | Status: AC

## 2023-06-10 NOTE — ED Provider Notes (Signed)
 Daniel Hughes    CSN: [id number] Arrival date & time: 06/10/23  0807      History   Chief Complaint Chief Complaint  Patient presents with   Emesis    HPI Daniel Hughes is a 53 y.o. male.  Patient presents with heartburn and upper abdominal discomfort x 5-6 days.  He took Pepcid; none today.  At the onset of his illness, he had fever and vomiting.  No fever in the last 2 to 3 days.  No vomiting since 06/05/2023.  No diarrhea.  Last bowel movement early this morning.  He denies chest pain or shortness of breath.  Patient ate egg, ham, toast, jelly breakfast this morning without difficulty.  He has an appointment to see his PCP on 06/20/2023.  His medical history includes hypertension and asthma.  The history is provided by the patient and medical records.    Past Medical History:  Diagnosis Date   Appendicitis    Asthma    Hypertension     Patient Active Problem List   Diagnosis Date Noted   Appendicitis 02/25/2015   Acute appendicitis with localized peritonitis     Past Surgical History:  Procedure Laterality Date   LAPAROSCOPIC APPENDECTOMY N/A 02/24/2015   Procedure: APPENDECTOMY LAPAROSCOPIC;  Surgeon: Ida Rogue, MD;  Location: [facility] ORS;  Service: General;  Laterality: N/A;       Home Medications    Prior to Admission medications   Medication Sig Start Date End Date Taking? Authorizing Provider  amLODipine (NORVASC) 5 MG tablet Take by mouth. 05/11/21  Yes [provider]  omeprazole (PRILOSEC) 20 MG capsule Take 1 capsule (20 mg total) by mouth daily for 14 days. 06/10/23 06/24/23 Yes Mickie Bail, NP  oxyCODONE-acetaminophen (ROXICET) 5-325 MG tablet Take 1-2 tablets by mouth every 4 (four) hours as needed for severe pain. Patient not taking: Reported on 06/10/2018 02/25/15   Gladis Riffle, MD    Family History Family History  Problem Relation Age of Onset   Diabetes Mother    Cancer Maternal Grandmother     Social  History Social History   Tobacco Use   Smoking status: Every Day    Types: Cigarettes   Smokeless tobacco: Never  Vaping Use   Vaping status: Never Used  Substance Use Topics   Alcohol use: Yes   Drug use: No     Allergies   Reglan [metoclopramide]   Review of Systems Review of Systems  Constitutional:  Positive for fever. Negative for chills.  Respiratory:  Negative for cough and shortness of breath.   Cardiovascular:  Negative for chest pain and palpitations.  Gastrointestinal:  Positive for abdominal pain, nausea and vomiting. Negative for constipation and diarrhea.     Physical Exam Triage Vital Signs ED Triage Vitals  Encounter Vitals Group     BP 06/10/23 0824 126/84     Systolic BP Percentile --      Diastolic BP Percentile --      Pulse Rate 06/10/23 0814 100     Resp 06/10/23 0814 18     Temp 06/10/23 0814 98.7 F (37.1 C)     Temp src --      SpO2 06/10/23 0814 97 %     Weight --      Height --      Head Circumference --      Peak Flow --      Pain Score 06/10/23 0825 7     Pain Loc --  Pain Education --      Exclude from Growth Chart --    No data found.  Updated Vital Signs BP 126/84   Pulse 100   Temp 98.7 F (37.1 C)   Resp 18   SpO2 97%   Visual Acuity Right Eye Distance:   Left Eye Distance:   Bilateral Distance:    Right Eye Near:   Left Eye Near:    Bilateral Near:     Physical Exam Constitutional:      General: He is not in acute distress. HENT:     Mouth/Throat:     Mouth: Mucous membranes are moist.  Cardiovascular:     Rate and Rhythm: Normal rate and regular rhythm.     Heart sounds: Normal heart sounds.  Pulmonary:     Effort: Pulmonary effort is normal. No respiratory distress.     Breath sounds: Normal breath sounds.  Abdominal:     General: Bowel sounds are normal.     Palpations: Abdomen is soft.     Tenderness: There is no abdominal tenderness. There is no guarding or rebound.  Neurological:      Mental Status: He is alert.      UC Treatments / Results  Labs (all labs ordered are listed, but only abnormal results are displayed) Labs Reviewed - No data to display  EKG   Radiology No results found.  Procedures Procedures (including critical care time)  Medications Ordered in UC Medications - No data to display  Initial Impression / Assessment and Plan / UC Course  I have reviewed the triage vital signs and the nursing notes.  Pertinent labs & imaging results that were available during my care of the patient were reviewed by me and considered in my medical decision making (see chart for details).   Upper abdominal pain, heartburn.  Afebrile, VSS.  Abdomen is soft and nontender with good bowel sounds.  EKG shows sinus rhythm, rate 88, no ST elevation, compared to previous from 06/11/2018.  Patient has been on various heartburn medication in the past but is out of these medications.  Treating today with omeprazole x 14 days.  ED precautions given.  Instructed patient to follow up with his PCP on Monday.  Education provided on abdominal pain and heartburn.  He agrees to plan of care.    Final Clinical Impressions(s) / UC Diagnoses   Final diagnoses:  Pain of upper abdomen  Heartburn     Discharge Instructions      Take the omeprazole as directed.  Follow up with your primary care provider on Monday.  Go to the emergency department if you have worsening symptoms.        ED Prescriptions     Medication Sig Dispense Auth. Provider   omeprazole (PRILOSEC) 20 MG capsule Take 1 capsule (20 mg total) by mouth daily for 14 days. 14 capsule Mickie Bail, NP      PDMP not reviewed this encounter.   Mickie Bail, NP 06/10/23 [phone]

## 2023-06-10 NOTE — ED Triage Notes (Addendum)
 Patient to Urgent Care with complaints of emesis/ fevers/ nausea. Denies any diarrhea. Symptoms started Sunday. Max temp 101. Has been able to tolerate food today..   Meds: Theraflu/ omeprazole.  Requests refill of amlodipine 5mg  (has appointment with pcp this month to refil). Last dose 1 month ago.

## 2023-06-10 NOTE — Discharge Instructions (Addendum)
 Take the omeprazole as directed.  Follow up with your primary care provider on Monday.  Go to the emergency department if you have worsening symptoms.
